# Patient Record
Sex: Male | Born: 1990 | Race: White | Hispanic: No | Marital: Single | Smoking: Never smoker
Health system: Southern US, Community
[De-identification: ages and names within clinical notes are randomized; demographics above are authoritative.]

---

## 2014-09-23 ENCOUNTER — Emergency Department (HOSPITAL_COMMUNITY)
Admission: EM | Admit: 2014-09-23 | Discharge: 2014-09-23 | Disposition: A | Discharge status: Home / Self Care | Payer: Self-pay | Attending: Emergency Medicine | Admitting: Emergency Medicine

## 2014-09-23 ENCOUNTER — Encounter (HOSPITAL_COMMUNITY): Payer: Self-pay | Admitting: Emergency Medicine

## 2014-09-23 DIAGNOSIS — Z207 Contact with and (suspected) exposure to pediculosis, acariasis and other infestations: Secondary | ICD-10-CM

## 2014-09-23 DIAGNOSIS — B86 Scabies: Secondary | ICD-10-CM | POA: Insufficient documentation

## 2014-09-23 DIAGNOSIS — Z2089 Contact with and (suspected) exposure to other communicable diseases: Secondary | ICD-10-CM

## 2014-09-23 MED ORDER — PERMETHRIN 5 % EX CREA: TOPICAL_CREAM | CUTANEOUS | Status: DC

## 2014-09-23 NOTE — ED Provider Notes (Signed)
CSN: [id number]     Arrival date & time 09/23/14  2119 History   First MD Initiated Contact with Patient 09/23/14 2135     Chief Complaint  Patient presents with  . Rash     (Consider location/radiation/quality/duration/timing/severity/associated sxs/prior Treatment) HPI Comments: Chief complaint: Possible exposure to scabies  Patient is a 23 year old male who presents to the emergency department because he has been around someone whose child was diagnosed with scabies. The patient states that a friend of his has been over to his home, and sat on his furniture. The patient states he is also been to the home of the friend and the child. The patient states he has not had any rash, and neither has he had any itching, but he was concerned for possible exposure wanted to be evaluated. The patient denies any medical illnesses. He has not seen any bugs or bites of any kind. He has not had this situation before.  Patient is a 23 y.o. male presenting with rash. The history is provided by the patient.  Rash Associated symptoms: no abdominal pain, no joint pain, no shortness of breath and not wheezing     History reviewed. No pertinent past medical history. History reviewed. No pertinent past surgical history. No family history on file. History  Substance Use Topics  . Smoking status: Never Smoker   . Smokeless tobacco: Not on file  . Alcohol Use: No    Review of Systems  Constitutional: Negative for activity change.       All ROS Neg except as noted in HPI  Eyes: Negative for photophobia and discharge.  Respiratory: Negative for cough, shortness of breath and wheezing.   Cardiovascular: Negative for chest pain and palpitations.  Gastrointestinal: Negative for abdominal pain and blood in stool.  Genitourinary: Negative for dysuria, frequency and hematuria.  Musculoskeletal: Negative for arthralgias, back pain and neck pain.  Skin: Negative.  Negative for rash.  Neurological: Negative for  dizziness, seizures and speech difficulty.  Psychiatric/Behavioral: Negative for hallucinations and confusion.      Allergies  Review of patient's allergies indicates not on file.  Home Medications   Prior to Admission medications   Not on File   BP 135/86  Pulse 71  Temp(Src) 98 F (36.7 C) (Oral)  Resp 24  Ht 5\' 10"  (1.778 m)  Wt 190 lb (86.183 kg)  BMI 27.26 kg/m2  SpO2 100% Physical Exam  Nursing note and vitals reviewed. Constitutional: He is oriented to person, place, and time. He appears well-developed and well-nourished.  Non-toxic appearance.  HENT:  Head: Normocephalic.  Right Ear: Tympanic membrane and external ear normal.  Left Ear: Tympanic membrane and external ear normal.  Eyes: EOM and lids are normal. Pupils are equal, round, and reactive to light.  Neck: Normal range of motion. Neck supple. Carotid bruit is not present.  Cardiovascular: Normal rate, regular rhythm, normal heart sounds, intact distal pulses and normal pulses.   Pulmonary/Chest: Breath sounds normal. No respiratory distress.  Abdominal: Soft. Bowel sounds are normal. There is no tenderness. There is no guarding.  Musculoskeletal: Normal range of motion.  Lymphadenopathy:       Head (right side): No submandibular adenopathy present.       Head (left side): No submandibular adenopathy present.    He has no cervical adenopathy.  Neurological: He is alert and oriented to person, place, and time. He has normal strength. No cranial nerve deficit or sensory deficit.  Skin: Skin is warm and dry.  No rash noted.  Psychiatric: He has a normal mood and affect. His speech is normal.    ED Course  Procedures (including critical care time) Labs Review Labs Reviewed - No data to display  Imaging Review No results found.   EKG Interpretation None      MDM  Vital signs are well within normal limits. Examination does not reveal any rash, crawling bites, or increased areas of redness or  itching. I have informed the patient of his vital signs, as well as the findings on today's examination. Patient will be given a prescription for Elimite in the event that he develops symptoms. Also advised patient to vacuum his furniture and carpet, and to changes linens.    Final diagnoses:  None    **I have reviewed nursing notes, vital signs, and all appropriate lab and imaging results for this patient.Kathie Dike*    Jonuel Butterfield M Veronique Warga, PA-C 09/23/14 2201

## 2014-09-23 NOTE — ED Notes (Signed)
Pt has been around someone with scabies and wants to be checked out. Pt states he is not itching and does not have a rash.

## 2014-09-25 NOTE — ED Provider Notes (Signed)
Medical screening examination/treatment/procedure(s) were performed by non-physician practitioner and as supervising physician I was immediately available for consultation/collaboration.    Vida RollerBrian D Brando Taves, MD 09/25/14 [phone]

## 2015-03-12 ENCOUNTER — Encounter (HOSPITAL_COMMUNITY): Payer: Self-pay | Admitting: *Deleted

## 2015-03-12 ENCOUNTER — Emergency Department (HOSPITAL_COMMUNITY)
Admission: EM | Admit: 2015-03-12 | Discharge: 2015-03-12 | Disposition: A | Discharge status: Home / Self Care | Payer: Self-pay | Attending: Emergency Medicine | Admitting: Emergency Medicine

## 2015-03-12 DIAGNOSIS — R21 Rash and other nonspecific skin eruption: Secondary | ICD-10-CM | POA: Insufficient documentation

## 2015-03-12 MED ORDER — CEFTRIAXONE SODIUM 250 MG IJ SOLR
250.0000 mg | Freq: Once | INTRAMUSCULAR | Status: AC
  Administered 2015-03-12: 250 mg via INTRAMUSCULAR
  Filled 2015-03-12: qty 250

## 2015-03-12 MED ORDER — PREDNISONE 10 MG PO TABS: ORAL_TABLET | ORAL | Status: DC

## 2015-03-12 MED ORDER — LIDOCAINE HCL (PF) 1 % IJ SOLN
INTRAMUSCULAR | Status: AC
  Filled 2015-03-12: qty 5

## 2015-03-12 MED ORDER — DIPHENHYDRAMINE HCL 25 MG PO TABS: 25.0000 mg | ORAL_TABLET | ORAL | Status: DC | PRN

## 2015-03-12 MED ORDER — AZITHROMYCIN 250 MG PO TABS
1000.0000 mg | ORAL_TABLET | Freq: Once | ORAL | Status: AC
  Administered 2015-03-12: 1000 mg via ORAL
  Filled 2015-03-12: qty 4

## 2015-03-12 NOTE — ED Notes (Signed)
Itching , no visible rash, concerned he may have scabies

## 2015-03-12 NOTE — ED Provider Notes (Signed)
CSN: [id number]     Arrival date & time 03/12/15  2209 History   First MD Initiated Contact with Patient 03/12/15 2230     No chief complaint on file.    (Consider location/radiation/quality/duration/timing/severity/associated sxs/prior Treatment) HPI  Mitchell Avery is a 24 y.o. male who presents to the Emergency Department complaining of persistent rash for one week.  He states he noticed a dry, scaly-looking rash to his thighs, groin, waist, and back.  He also complains of constant itching that is worse after bathing and at night.  He has tried over the counter medications without relief.  He states a family member was treated for scabies over one month ago, but he did not have symptoms until recently. He does report unprotected sex with multiple partners and is concerned about a possible STD.  He denies fever, chills, malaise, abd pain, dysuria, penile discharge or testicle pain.     History reviewed. No pertinent past medical history. History reviewed. No pertinent past surgical history. History reviewed. No pertinent family history. History  Substance Use Topics  . Smoking status: Never Smoker   . Smokeless tobacco: Not on file  . Alcohol Use: No    Review of Systems  Constitutional: Negative for fever, chills, activity change and appetite change.  HENT: Negative for facial swelling, sore throat and trouble swallowing.   Respiratory: Negative for chest tightness, shortness of breath and wheezing.   Genitourinary: Positive for genital sores. Negative for dysuria, hematuria, discharge, scrotal swelling, difficulty urinating, penile pain and testicular pain.  Musculoskeletal: Negative for back pain, arthralgias, neck pain and neck stiffness.  Skin: Positive for rash. Negative for wound.  Neurological: Negative for dizziness, weakness, numbness and headaches.  All other systems reviewed and are negative.     Allergies  Review of patient's allergies indicates no known  allergies.  Home Medications   Prior to Admission medications   Medication Sig Start Date End Date Taking? Authorizing Provider  diphenhydrAMINE (BENADRYL) 25 MG tablet Take 1 tablet (25 mg total) by mouth every 4 (four) hours as needed for itching. 03/12/15   Tammi Draylen Lobue, PA-C  permethrin (ELIMITE) 5 % cream Please apply from neck to toes. Leave on for 6 hours and shower off. May repeat in 5 days if needed. 09/23/14   Ivery Quale, PA-C  predniSONE (DELTASONE) 10 MG tablet Take 6 tablets day one, 5 tablets day two, 4 tablets day three, 3 tablets day four, 2 tablets day five, then 1 tablet day six 03/12/15   Tammi Milo Solana, PA-C   BP 129/78 mmHg  Pulse 73  Temp(Src) 98.8 F (37.1 C) (Oral)  Resp 20  Ht  (1.803 m)  Wt 180 lb (81.647 kg)  BMI 25.12 kg/m2  SpO2 98% Physical Exam  Constitutional: He is oriented to person, place, and time. He appears well-developed and well-nourished. No distress.  HENT:  Head: Normocephalic and atraumatic.  Mouth/Throat: Oropharynx is clear and moist. No oral lesions.  Neck: Normal range of motion. Neck supple.  Cardiovascular: Normal rate, regular rhythm, normal heart sounds and intact distal pulses.   No murmur heard. Pulmonary/Chest: Effort normal and breath sounds normal. No respiratory distress.  Genitourinary: Testes normal. Cremasteric reflex is present. Right testis shows no swelling and no tenderness. Left testis shows no swelling and no tenderness. Circumcised. No penile erythema or penile tenderness. No discharge found.  Several small scaly lesions to the shaft of the penis and one to the glans  Musculoskeletal: Normal range of motion. He  exhibits no edema or tenderness.  Lymphadenopathy:    He has no cervical adenopathy.       Right: No inguinal adenopathy present.       Left: No inguinal adenopathy present.  Neurological: He is alert and oriented to person, place, and time. He exhibits normal muscle tone. Coordination normal.  Skin:  Skin is warm. Rash noted. There is erythema.  Few erythematous papules to the abdomen.  Scattered, small macular lesions to the left thigh, bilateral groin, and waist.  Hands and web spaces are spared.    Nursing note and vitals reviewed.   ED Course  Procedures (including critical care time) Labs Review Labs Reviewed  RPR  HIV ANTIBODY (ROUTINE TESTING)  GC/CHLAMYDIA PROBE AMP ()    Imaging Review No results found.   EKG Interpretation None      MDM   Final diagnoses:  Rash and nonspecific skin eruption    Pt is well appearing, non-toxic.  Rash appears c/w non-specific dermatitis, although given patient's history syphilis is also considered.     Cultures pending.  Patient was advised to use safer sex practices.  Treated tonight with IM rocephin and zithromax.  Pt agrees to f/u with health dept.     Severiano Gilbertammi Happy Begeman, PA-C 03/12/15 2326  Lorre NickAnthony Allen, MD 03/13/15 [phone]

## 2015-03-12 NOTE — Discharge Instructions (Signed)
Rash A rash is a change in the color or feel of your skin. There are many different types of rashes. You may have other problems along with your rash. HOME CARE  Avoid the thing that caused your rash.  Do not scratch your rash.  You may take cools baths to help stop itching.  Only take medicines as told by your doctor.  Keep all doctor visits as told. GET HELP RIGHT AWAY IF:   Your pain, puffiness (swelling), or redness gets worse.  You have a fever.  You have new or severe problems.  You have body aches, watery poop (diarrhea), or you throw up (vomit).  Your rash is not better after 3 days. MAKE SURE YOU:   Understand these instructions.  Will watch your condition.  Will get help right away if you are not doing well or get worse. Document Released: 05/08/2008 Document Revised: 02/12/2012 Document Reviewed: 09/04/2011 Exit[employer] Patient Information 2015 [employer]. This information is not intended to replace advice given to you by your health care provider. Make sure you discuss any questions you have with your health care provider.  

## 2015-03-12 NOTE — ED Notes (Signed)
Patient given discharge instruction, verbalized understand. Patient ambulatory out of the department.  

## 2015-03-14 LAB — RPR: RPR: NONREACTIVE

## 2015-03-15 LAB — GC/CHLAMYDIA PROBE AMP (~~LOC~~) NOT AT ARMC
CHLAMYDIA, DNA PROBE: NEGATIVE
NEISSERIA GONORRHEA: NEGATIVE

## 2015-03-16 LAB — HIV ANTIBODY (ROUTINE TESTING W REFLEX): HIV Screen 4th Generation wRfx: NONREACTIVE

## 2015-03-27 ENCOUNTER — Emergency Department (HOSPITAL_COMMUNITY)
Admission: EM | Admit: 2015-03-27 | Discharge: 2015-03-27 | Disposition: A | Discharge status: Home / Self Care | Payer: Self-pay | Attending: Emergency Medicine | Admitting: Emergency Medicine

## 2015-03-27 ENCOUNTER — Encounter (HOSPITAL_COMMUNITY): Payer: Self-pay | Admitting: *Deleted

## 2015-03-27 DIAGNOSIS — R21 Rash and other nonspecific skin eruption: Secondary | ICD-10-CM | POA: Insufficient documentation

## 2015-03-27 DIAGNOSIS — Z7952 Long term (current) use of systemic steroids: Secondary | ICD-10-CM | POA: Insufficient documentation

## 2015-03-27 MED ORDER — PERMETHRIN 5 % EX CREA: TOPICAL_CREAM | CUTANEOUS | Status: DC

## 2015-03-27 MED ORDER — HYDROXYZINE HCL 25 MG PO TABS: 25.0000 mg | ORAL_TABLET | Freq: Four times a day (QID) | ORAL | Status: DC | PRN

## 2015-03-27 NOTE — ED Provider Notes (Signed)
CSN: [id number]     Arrival date & time 03/27/15  2021 History   First MD Initiated Contact with Patient 03/27/15 2047     Chief Complaint  Patient presents with  . Rash     (Consider location/radiation/quality/duration/timing/severity/associated sxs/prior Treatment) HPI   Mitchell Avery is a 24 y.o. male who presents to the Emergency Department complaining of persistent rash.  He reports intermittent, intense itching, and waxing and waning rash to his arms, hands and trunk.  This is a chronic problem and his third visit here for same symptoms.  He states that he made an appt to see a dermatologist, but could not keep the appt because he had to work.  He states that he completed a prednisone course without relief.  He denies swelling, fever, pain, difficulty swallowing or breathing.  He reports previous exposure to scabies and has been prescribed permethrin cream previously.      History reviewed. No pertinent past medical history. History reviewed. No pertinent past surgical history. History reviewed. No pertinent family history. History  Substance Use Topics  . Smoking status: Never Smoker   . Smokeless tobacco: Not on file  . Alcohol Use: No    Review of Systems  Constitutional: Negative for fever, chills, activity change and appetite change.  HENT: Negative for facial swelling, sore throat and trouble swallowing.   Respiratory: Negative for chest tightness, shortness of breath and wheezing.   Musculoskeletal: Negative for neck pain and neck stiffness.  Skin: Positive for rash. Negative for wound.  Neurological: Negative for dizziness, weakness, numbness and headaches.  All other systems reviewed and are negative.     Allergies  Review of patient's allergies indicates no known allergies.  Home Medications   Prior to Admission medications   Medication Sig Start Date End Date Taking? Authorizing Provider  diphenhydrAMINE (BENADRYL) 25 MG tablet Take 1 tablet (25 mg  total) by mouth every 4 (four) hours as needed for itching. 03/12/15   Kerryn Tennant, PA-C  hydrOXYzine (ATARAX/VISTARIL) 25 MG tablet Take 1 tablet (25 mg total) by mouth every 6 (six) hours as needed for itching. 03/27/15   Tou Hayner, PA-C  permethrin (ELIMITE) 5 % cream Apply from the neck down, leave on for 10 hrs. Then wash off.  May re-apply in one week. 03/27/15   Ansh Fauble, PA-C  predniSONE (DELTASONE) 10 MG tablet Take 6 tablets day one, 5 tablets day two, 4 tablets day three, 3 tablets day four, 2 tablets day five, then 1 tablet day six 03/12/15   Shuree Brossart, PA-C   BP 139/90 mmHg  Pulse 75  Temp(Src) 98.6 F (37 C) (Oral)  Resp 18  Ht 5\' 11"  (1.803 m)  Wt 174 lb (78.926 kg)  BMI 24.28 kg/m2  SpO2 100% Physical Exam  Constitutional: He is oriented to person, place, and time. He appears well-developed and well-nourished. No distress.  HENT:  Head: Normocephalic and atraumatic.  Mouth/Throat: Oropharynx is clear and moist.  Eyes: No scleral icterus.  Neck: Normal range of motion. Neck supple.  Cardiovascular: Normal rate, regular rhythm, normal heart sounds and intact distal pulses.   No murmur heard. Pulmonary/Chest: Effort normal and breath sounds normal. No respiratory distress. He has no wheezes.  Musculoskeletal: He exhibits no edema or tenderness.  Lymphadenopathy:    He has no cervical adenopathy.  Neurological: He is alert and oriented to person, place, and time. He exhibits normal muscle tone. Coordination normal.  Skin: Skin is warm. Rash noted. There is erythema.  No obvious rash seen on exam, mild excoriations to the bilateral forearms.  No edema or petechiae.    Nursing note and vitals reviewed.   ED Course  Procedures (including critical care time) Labs Review Labs Reviewed - No data to display  Imaging Review No results found.   EKG Interpretation None      MDM   Final diagnoses:  Rash and nonspecific skin eruption    Pt is well  appearing.  Third ED visit for same.  When asked if he has made an appt with dermatology, he states that he missed his appt due to work and going out of town next week, so will not be able to make another one.  No obvious rash noted on exam.  Pt is well appearing, vitals stable will repeat Permethrin cream and stressed importance of dermatology f/u    Pauline Aus, PA-C 03/29/15 1453  Rolland Porter, MD 04/06/15 [phone]

## 2015-03-27 NOTE — ED Notes (Signed)
Pt states his cousin has had scabies. Pt states he has been here before because he was itching all over. Pt states he still itches.

## 2015-03-27 NOTE — Discharge Instructions (Signed)
Rash A rash is a change in the color or feel of your skin. There are many different types of rashes. You may have other problems along with your rash. HOME CARE  Avoid the thing that caused your rash.  Do not scratch your rash.  You may take cools baths to help stop itching.  Only take medicines as told by your doctor.  Keep all doctor visits as told. GET HELP RIGHT AWAY IF:   Your pain, puffiness (swelling), or redness gets worse.  You have a fever.  You have new or severe problems.  You have body aches, watery poop (diarrhea), or you throw up (vomit).  Your rash is not better after 3 days. MAKE SURE YOU:   Understand these instructions.  Will watch your condition.  Will get help right away if you are not doing well or get worse. Document Released: 05/08/2008 Document Revised: 02/12/2012 Document Reviewed: 09/04/2011 Exit[employer] Patient Information 2015 [employer]. This information is not intended to replace advice given to you by your health care provider. Make sure you discuss any questions you have with your health care provider.  

## 2015-03-30 ENCOUNTER — Emergency Department (HOSPITAL_COMMUNITY): Payer: No Typology Code available for payment source

## 2015-03-30 ENCOUNTER — Encounter (HOSPITAL_COMMUNITY): Payer: Self-pay

## 2015-03-30 ENCOUNTER — Emergency Department (HOSPITAL_COMMUNITY)
Admission: EM | Admit: 2015-03-30 | Discharge: 2015-03-31 | Disposition: A | Discharge status: Home / Self Care | Payer: No Typology Code available for payment source | Attending: Emergency Medicine | Admitting: Emergency Medicine

## 2015-03-30 DIAGNOSIS — Z23 Encounter for immunization: Secondary | ICD-10-CM | POA: Diagnosis not present

## 2015-03-30 DIAGNOSIS — Z79899 Other long term (current) drug therapy: Secondary | ICD-10-CM | POA: Insufficient documentation

## 2015-03-30 DIAGNOSIS — Y92410 Unspecified street and highway as the place of occurrence of the external cause: Secondary | ICD-10-CM | POA: Diagnosis not present

## 2015-03-30 DIAGNOSIS — S41112A Laceration without foreign body of left upper arm, initial encounter: Secondary | ICD-10-CM | POA: Insufficient documentation

## 2015-03-30 DIAGNOSIS — Y9389 Activity, other specified: Secondary | ICD-10-CM | POA: Diagnosis not present

## 2015-03-30 DIAGNOSIS — Y998 Other external cause status: Secondary | ICD-10-CM | POA: Diagnosis not present

## 2015-03-30 DIAGNOSIS — S8991XA Unspecified injury of right lower leg, initial encounter: Secondary | ICD-10-CM | POA: Diagnosis not present

## 2015-03-30 DIAGNOSIS — S59902A Unspecified injury of left elbow, initial encounter: Secondary | ICD-10-CM | POA: Insufficient documentation

## 2015-03-30 LAB — URINALYSIS, ROUTINE W REFLEX MICROSCOPIC
Bilirubin Urine: NEGATIVE
GLUCOSE, UA: NEGATIVE mg/dL
KETONES UR: NEGATIVE mg/dL
Leukocytes, UA: NEGATIVE
Nitrite: NEGATIVE
Protein, ur: NEGATIVE mg/dL
Specific Gravity, Urine: 1.02 (ref 1.005–1.030)
UROBILINOGEN UA: 0.2 mg/dL (ref 0.0–1.0)
pH: 6 (ref 5.0–8.0)

## 2015-03-30 LAB — CBC WITH DIFFERENTIAL/PLATELET
Basophils Absolute: 0 10*3/uL (ref 0.0–0.1)
Basophils Relative: 0 % (ref 0–1)
Eosinophils Absolute: 0.2 10*3/uL (ref 0.0–0.7)
Eosinophils Relative: 3 % (ref 0–5)
HCT: 45.9 % (ref 39.0–52.0)
HEMOGLOBIN: 15.2 g/dL (ref 13.0–17.0)
Lymphocytes Relative: 15 % (ref 12–46)
Lymphs Abs: 1.3 10*3/uL (ref 0.7–4.0)
MCH: 27.1 pg (ref 26.0–34.0)
MCHC: 33.1 g/dL (ref 30.0–36.0)
MCV: 82 fL (ref 78.0–100.0)
MONO ABS: 0.6 10*3/uL (ref 0.1–1.0)
Monocytes Relative: 6 % (ref 3–12)
Neutro Abs: 6.6 10*3/uL (ref 1.7–7.7)
Neutrophils Relative %: 76 % (ref 43–77)
Platelets: 285 10*3/uL (ref 150–400)
RBC: 5.6 MIL/uL (ref 4.22–5.81)
RDW: 13.8 % (ref 11.5–15.5)
WBC: 8.7 10*3/uL (ref 4.0–10.5)

## 2015-03-30 LAB — URINE MICROSCOPIC-ADD ON

## 2015-03-30 LAB — I-STAT CHEM 8, ED
BUN: 16 mg/dL (ref 6–23)
CALCIUM ION: 1.18 mmol/L (ref 1.12–1.23)
Chloride: 105 mmol/L (ref 96–112)
Creatinine, Ser: 1.1 mg/dL (ref 0.50–1.35)
Glucose, Bld: 108 mg/dL — ABNORMAL HIGH (ref 70–99)
HCT: 49 % (ref 39.0–52.0)
HEMOGLOBIN: 16.7 g/dL (ref 13.0–17.0)
POTASSIUM: 4 mmol/L (ref 3.5–5.1)
Sodium: 142 mmol/L (ref 135–145)
TCO2: 23 mmol/L (ref 0–100)

## 2015-03-30 LAB — ETHANOL: Alcohol, Ethyl (B): <5 mg/dL (ref 0–9)

## 2015-03-30 MED ORDER — SODIUM CHLORIDE 0.9 % IV BOLUS (SEPSIS)
1000.0000 mL | Freq: Once | INTRAVENOUS | Status: AC
  Administered 2015-03-30: 1000 mL via INTRAVENOUS

## 2015-03-30 MED ORDER — HYDROMORPHONE HCL 1 MG/ML IJ SOLN
1.0000 mg | Freq: Once | INTRAMUSCULAR | Status: AC
  Administered 2015-03-30: 1 mg via INTRAVENOUS
  Filled 2015-03-30: qty 1

## 2015-03-30 MED ORDER — ONDANSETRON HCL 4 MG/2ML IJ SOLN
4.0000 mg | Freq: Once | INTRAMUSCULAR | Status: AC
  Administered 2015-03-30: 4 mg via INTRAVENOUS
  Filled 2015-03-30: qty 2

## 2015-03-30 MED ORDER — CEPHALEXIN 500 MG PO CAPS: 500.0000 mg | ORAL_CAPSULE | Freq: Three times a day (TID) | ORAL | Status: DC

## 2015-03-30 MED ORDER — TETANUS-DIPHTH-ACELL PERTUSSIS 5-2.5-18.5 LF-MCG/0.5 IM SUSP
0.5000 mL | Freq: Once | INTRAMUSCULAR | Status: AC
  Administered 2015-03-30: 0.5 mL via INTRAMUSCULAR
  Filled 2015-03-30: qty 0.5

## 2015-03-30 MED ORDER — LIDOCAINE-EPINEPHRINE (PF) 1 %-1:200000 IJ SOLN
20.0000 mL | Freq: Once | INTRAMUSCULAR | Status: AC
  Administered 2015-03-31: 20 mL via INTRADERMAL
  Filled 2015-03-30: qty 10

## 2015-03-30 MED ORDER — IOHEXOL 300 MG/ML  SOLN
100.0000 mL | Freq: Once | INTRAMUSCULAR | Status: AC | PRN
  Administered 2015-03-30: 100 mL via INTRAVENOUS

## 2015-03-30 MED ORDER — HYDROCODONE-ACETAMINOPHEN 5-325 MG PO TABS: 1.0000 | ORAL_TABLET | Freq: Four times a day (QID) | ORAL | Status: DC | PRN

## 2015-03-30 NOTE — Discharge Instructions (Signed)
Follow up with Dr. Conchita ParisNundkumar in 4 weeks for your neck.  Follow up with Dr. Romeo AppleHarrison next week for recheck of your leg and arm.  Suture out in 10 days.  Clean cuts twice a day with soap and water
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
## 2015-03-30 NOTE — ED Notes (Signed)
Pt's sister out to say that pt is in pain.
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
## 2015-03-31 MED ORDER — LIDOCAINE HCL 2 % EX GEL
1.0000 "application " | Freq: Once | CUTANEOUS | Status: AC
  Administered 2015-03-31: 1 via TOPICAL
  Filled 2015-03-31: qty 20

## 2015-03-31 MED ORDER — OXYCODONE-ACETAMINOPHEN 5-325 MG PO TABS: 1.0000 | ORAL_TABLET | Freq: Four times a day (QID) | ORAL | Status: DC | PRN
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
## 2015-04-13 MED FILL — Oxycodone w/ Acetaminophen Tab 5-325 MG: ORAL | Qty: 6 | Status: AC
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
## 2018-07-12 ENCOUNTER — Emergency Department (HOSPITAL_BASED_OUTPATIENT_CLINIC_OR_DEPARTMENT_OTHER)
Admission: EM | Admit: 2018-07-12 | Discharge: 2018-07-13 | Disposition: A | Discharge status: Home / Self Care | Payer: Self-pay | Attending: Emergency Medicine | Admitting: Emergency Medicine

## 2018-07-12 ENCOUNTER — Other Ambulatory Visit: Payer: Self-pay

## 2018-07-12 ENCOUNTER — Encounter (HOSPITAL_BASED_OUTPATIENT_CLINIC_OR_DEPARTMENT_OTHER): Payer: Self-pay | Admitting: *Deleted

## 2018-07-12 DIAGNOSIS — J029 Acute pharyngitis, unspecified: Secondary | ICD-10-CM | POA: Insufficient documentation

## 2018-07-12 DIAGNOSIS — Z79899 Other long term (current) drug therapy: Secondary | ICD-10-CM | POA: Insufficient documentation

## 2018-07-12 LAB — GROUP A STREP BY PCR: Group A Strep by PCR: NOT DETECTED

## 2018-07-12 MED ORDER — ACETAMINOPHEN 325 MG PO TABS
650.0000 mg | ORAL_TABLET | Freq: Once | ORAL | Status: AC | PRN
  Administered 2018-07-12: 650 mg via ORAL
  Filled 2018-07-12: qty 2

## 2018-07-12 MED ORDER — SODIUM CHLORIDE 0.9 % IV BOLUS
1000.0000 mL | Freq: Once | INTRAVENOUS | Status: AC
  Administered 2018-07-12: 1000 mL via INTRAVENOUS

## 2018-07-12 MED ORDER — ACETAMINOPHEN 325 MG PO TABS: 650.0000 mg | ORAL_TABLET | Freq: Once | ORAL | Status: DC

## 2018-07-12 NOTE — ED Provider Notes (Signed)
[facility] EMERGENCY DEPARTMENT Provider Note  CSN: [id number] Arrival date & time: 07/12/18  2122  History   Chief Complaint Chief Complaint  Patient presents with  . Headache  . Sore Throat    HPI Mitchell Avery is a 27 y.o. male with no significant medical history who presented to the ED for sore throat x3 days. Associated symptoms: fever, fatigue and headache. Denies known sick contacts, but states he is frequently around people for his job. Denies cough, congestion, rhinorrhea and postnasal drip. Denies arthralgias, rashes, chest pain, palpitations or urinary complaints. Patient has tried nothing prior to coming to the ED.  History reviewed. No pertinent past medical history.  There are no active problems to display for this patient.   History reviewed. No pertinent surgical history.      Home Medications    Prior to Admission medications   Medication Sig Start Date End Date Taking? Authorizing Provider  cephALEXin (KEFLEX) 500 MG capsule Take 1 capsule (500 mg total) by mouth 3 (three) times daily. 03/30/15   Bethann BerkshireZammit, Joseph, MD  diphenhydrAMINE (BENADRYL) 25 MG tablet Take 1 tablet (25 mg total) by mouth every 4 (four) hours as needed for itching. Patient not taking: Reported on 03/30/2015 03/12/15   Pauline Ausriplett, Tammy, PA-C  HYDROcodone-acetaminophen (NORCO/VICODIN) 5-325 MG per tablet Take 1 tablet by mouth every 6 (six) hours as needed. 03/30/15   Bethann BerkshireZammit, Joseph, MD  hydrOXYzine (ATARAX/VISTARIL) 25 MG tablet Take 1 tablet (25 mg total) by mouth every 6 (six) hours as needed for itching. 03/27/15   Triplett, Tammy, PA-C  oxyCODONE-acetaminophen (PERCOCET/ROXICET) 5-325 MG per tablet Take 1 tablet by mouth every 6 (six) hours as needed. 03/31/15   Bethann BerkshireZammit, Joseph, MD  permethrin (ELIMITE) 5 % cream Apply from the neck down, leave on for 10 hrs. Then wash off.  May re-apply in one week. 03/27/15   Triplett, Tammy, PA-C  predniSONE (DELTASONE) 10 MG tablet Take 6 tablets day  one, 5 tablets day two, 4 tablets day three, 3 tablets day four, 2 tablets day five, then 1 tablet day six Patient not taking: Reported on 03/30/2015 03/12/15   Pauline Ausriplett, Tammy, PA-C    Family History No family history on file.  Social History Social History   Tobacco Use  . Smoking status: Never Smoker  . Smokeless tobacco: Never Used  Substance Use Topics  . Alcohol use: Yes    Comment: occ  . Drug use: No     Allergies   Patient has no known allergies.   Review of Systems Review of Systems  Constitutional: Positive for fatigue and fever. Negative for activity change and appetite change.  HENT: Positive for sore throat. Negative for congestion, drooling, ear pain, postnasal drip, rhinorrhea, sneezing and voice change.   Eyes: Negative.   Respiratory: Negative.   Cardiovascular: Negative.   Gastrointestinal: Negative.   Genitourinary: Negative.   Musculoskeletal: Negative.   Skin: Negative.   Neurological: Positive for headaches. Negative for dizziness, weakness and light-headedness.     Physical Exam Updated Vital Signs BP 113/71   Pulse 95   Temp 98.5 F (36.9 C) (Oral)   Resp 18   Ht 5\' 10"  (1.778 m)   Wt 90.7 kg   SpO2 97%   BMI 28.70 kg/m   Physical Exam  Constitutional: He appears well-developed and well-nourished.  HENT:  Head: Normocephalic and atraumatic.  Right Ear: Tympanic membrane, external ear and ear canal normal.  Left Ear: Tympanic membrane, external ear and ear canal  normal.  Mouth/Throat: Uvula is midline, oropharynx is clear and moist and mucous membranes are normal. No oral lesions. No trismus in the jaw. Tonsils are 1+ on the right. Tonsillar exudate.  Eyes: Pupils are equal, round, and reactive to light. Conjunctivae, EOM and lids are normal.  Neck: Normal range of motion. Neck supple.  Cardiovascular: Normal rate, regular rhythm and normal heart sounds.  Pulmonary/Chest: Effort normal and breath sounds normal. No respiratory  distress.  Musculoskeletal: Normal range of motion. He exhibits no tenderness.  Skin: Skin is warm and intact. Capillary refill takes less than 2 seconds. No rash noted.  Nursing note and vitals reviewed.    ED Treatments / Results  Labs (all labs ordered are listed, but only abnormal results are displayed) Labs Reviewed  GROUP A STREP BY PCR    EKG None  Radiology No results found.  Procedures Procedures (including critical care time)  Medications Ordered in ED Medications  acetaminophen (TYLENOL) tablet 650 mg (650 mg Oral Given 07/12/18 2136)  sodium chloride 0.9 % bolus 1,000 mL (1,000 mLs Intravenous New Bag/Given 07/12/18 2230)     Initial Impression / Assessment and Plan / ED Course  Triage vital signs and the nursing notes have been reviewed.  Pertinent labs & imaging results that were available during care of the patient were reviewed and considered in medical decision making (see chart for details).  Patient presents febrile and tachycardic with complaints of sore throat. Physical exam significant for right tonsillar exudate and swelling. Concern for strep.There is no peritonsillar abscess visualized. He does not have trismus, voice changes, respiratory complaints and is not in respiratory distress that raises concern for retropharyngeal process.  Clinical Course as of Jul 12 2337  Fri Jul 12, 2018  2138 Tylenol given in triage for fever. Tachycardic at 146 upon arrival. Will order 1L NS bolus.   [GM]  2205 Strep negative   [GM]  2307 HR re-evaluated after NS bolus initiated. HR is decreasing appropriately with fluids.   [GM]  2337 Patient afebrile with normal HR prior to discharge.   [GM]    Clinical Course User Index [GM] Mortis, Sharyon Medicus, PA-C   Patient does not have any other respiratory symptoms such as cough, congestion or postnasal drip. He also does not have any arthralgias, skin rashes or GU complaints that would suggest that the pharyngitis is  related to a STD infection. Since strep test is negative, there is no indication for antibiotics today. Likely viral etiology.  Final Clinical Impressions(s) / ED Diagnoses  1. Viral Pharyngitis. Education provided on OTC and supportive treatment for symptom relief. Advised to follow-up with medical provider if sore throat continues or worsens over the next 3-4 days.  Dispo: Home. After thorough clinical evaluation, this patient is determined to be medically stable and can be safely discharged with the previously mentioned treatment and/or outpatient follow-up/referral(s). At this time, there are no other apparent medical conditions that require further screening, evaluation or treatment.   Final diagnoses:  Viral pharyngitis    ED Discharge Orders    None        Reva Bores 07/12/18 2338    Gwyneth Sprout, MD 07/12/18 2351

## 2018-07-12 NOTE — Discharge Instructions (Addendum)
Your strep test was negative which means your sore throat is likely coming from a viral source. You may use over the counter cold and sore throat products for symptom relief. Tylenol can be continued for fever.  If you continue to have sore throat and fever or if it worsens by Monday 07/15/18, please follow-up with a medical provider.  Get well soon. Enjoy your weekend.

## 2018-07-12 NOTE — ED Triage Notes (Signed)
Body aches x 3 days. Sore throat and chills.

## 2018-07-15 ENCOUNTER — Emergency Department (HOSPITAL_COMMUNITY): Payer: Self-pay

## 2018-07-15 ENCOUNTER — Emergency Department (HOSPITAL_COMMUNITY)
Admission: EM | Admit: 2018-07-15 | Discharge: 2018-07-15 | Disposition: A | Discharge status: Home / Self Care | Payer: Self-pay | Attending: Emergency Medicine | Admitting: Emergency Medicine

## 2018-07-15 ENCOUNTER — Other Ambulatory Visit: Payer: Self-pay

## 2018-07-15 ENCOUNTER — Encounter (HOSPITAL_COMMUNITY): Payer: Self-pay | Admitting: Emergency Medicine

## 2018-07-15 DIAGNOSIS — J039 Acute tonsillitis, unspecified: Secondary | ICD-10-CM | POA: Insufficient documentation

## 2018-07-15 DIAGNOSIS — J029 Acute pharyngitis, unspecified: Secondary | ICD-10-CM | POA: Insufficient documentation

## 2018-07-15 LAB — BASIC METABOLIC PANEL
Anion gap: 10 (ref 5–15)
BUN: 13 mg/dL (ref 6–20)
CALCIUM: 9.1 mg/dL (ref 8.9–10.3)
CO2: 25 mmol/L (ref 22–32)
Chloride: 99 mmol/L (ref 98–111)
Creatinine, Ser: 1.1 mg/dL (ref 0.61–1.24)
GFR calc Af Amer: >60 mL/min (ref >60)
GFR calc non Af Amer: >60 mL/min (ref >60)
GLUCOSE: 97 mg/dL (ref 70–99)
Potassium: 3.5 mmol/L (ref 3.5–5.1)
Sodium: 134 mmol/L — ABNORMAL LOW (ref 135–145)

## 2018-07-15 LAB — CBC WITH DIFFERENTIAL/PLATELET
Abs Immature Granulocytes: 0.1 10*3/uL (ref 0.0–0.1)
BASOS PCT: 1 %
Basophils Absolute: 0.1 10*3/uL (ref 0.0–0.1)
Eosinophils Absolute: 0 10*3/uL (ref 0.0–0.7)
Eosinophils Relative: 0 %
HEMATOCRIT: 42.5 % (ref 39.0–52.0)
Hemoglobin: 13.7 g/dL (ref 13.0–17.0)
Immature Granulocytes: 1 %
LYMPHS ABS: 1.3 10*3/uL (ref 0.7–4.0)
Lymphocytes Relative: 14 %
MCH: 25.9 pg — AB (ref 26.0–34.0)
MCHC: 32.2 g/dL (ref 30.0–36.0)
MCV: 80.3 fL (ref 78.0–100.0)
MONO ABS: 1.2 10*3/uL — AB (ref 0.1–1.0)
Monocytes Relative: 13 %
Neutro Abs: 6.6 10*3/uL (ref 1.7–7.7)
Neutrophils Relative %: 71 %
PLATELETS: 289 10*3/uL (ref 150–400)
RBC: 5.29 MIL/uL (ref 4.22–5.81)
RDW: 12.8 % (ref 11.5–15.5)
WBC: 9.2 10*3/uL (ref 4.0–10.5)

## 2018-07-15 LAB — GROUP A STREP BY PCR: Group A Strep by PCR: NOT DETECTED

## 2018-07-15 LAB — I-STAT CG4 LACTIC ACID, ED: Lactic Acid, Venous: 0.81 mmol/L (ref 0.5–1.9)

## 2018-07-15 MED ORDER — SODIUM CHLORIDE 0.9 % IV BOLUS
1000.0000 mL | Freq: Once | INTRAVENOUS | Status: AC
  Administered 2018-07-15: 1000 mL via INTRAVENOUS

## 2018-07-15 MED ORDER — IOHEXOL 300 MG/ML  SOLN
75.0000 mL | Freq: Once | INTRAMUSCULAR | Status: AC | PRN
  Administered 2018-07-15: 75 mL via INTRAVENOUS

## 2018-07-15 MED ORDER — ACETAMINOPHEN 325 MG PO TABS
650.0000 mg | ORAL_TABLET | Freq: Once | ORAL | Status: AC | PRN
  Administered 2018-07-15: 650 mg via ORAL
  Filled 2018-07-15: qty 2

## 2018-07-15 MED ORDER — KETOROLAC TROMETHAMINE 30 MG/ML IJ SOLN
15.0000 mg | Freq: Once | INTRAMUSCULAR | Status: AC
  Administered 2018-07-15: 15 mg via INTRAVENOUS
  Filled 2018-07-15: qty 1

## 2018-07-15 MED ORDER — DEXAMETHASONE SODIUM PHOSPHATE 10 MG/ML IJ SOLN
10.0000 mg | Freq: Once | INTRAMUSCULAR | Status: AC
  Administered 2018-07-15: 10 mg via INTRAVENOUS
  Filled 2018-07-15: qty 1

## 2018-07-15 MED ORDER — AMOXICILLIN 500 MG PO CAPS: 500.0000 mg | ORAL_CAPSULE | Freq: Two times a day (BID) | ORAL | 0 refills | Status: AC

## 2018-07-15 NOTE — ED Notes (Signed)
Pt informed of delay called CT, 3 patients in front of patient

## 2018-07-15 NOTE — ED Provider Notes (Signed)
[facility] EMERGENCY DEPARTMENT Provider Note   CSN: [id number] Arrival date & time: 07/15/18  1231     History   Chief Complaint Chief Complaint  Patient presents with  . Sore Throat  . Fatigue  . Generalized Body Aches    HPI Mitchell Avery is a 27 y.o. male.  HPI   Mitchell Avery is a 27 y.o. male, patient with no pertinent past medical history, presenting to the ED with sore throat increasing over the last 6 days.  Pain is largely on the right side, sharp, severe, radiating superiorly.  States he has been taking Tylenol.  Endorses poor oral intake.  States he now has a headache and nausea.  Was seen in the ED on August 9 and diagnosed with viral pharyngitis.  Denies drooling, shortness of breath, cough, change in voice, difficulty swallowing, neck stiffness, rash, or any other complaints.    History reviewed. No pertinent past medical history.  There are no active problems to display for this patient.   History reviewed. No pertinent surgical history.      Home Medications    Prior to Admission medications   Medication Sig Start Date End Date Taking? Authorizing Provider  cephALEXin (KEFLEX) 500 MG capsule Take 1 capsule (500 mg total) by mouth 3 (three) times daily. 03/30/15   Bethann Berkshire, MD  diphenhydrAMINE (BENADRYL) 25 MG tablet Take 1 tablet (25 mg total) by mouth every 4 (four) hours as needed for itching. Patient not taking: Reported on 03/30/2015 03/12/15   Pauline Aus, PA-C  HYDROcodone-acetaminophen (NORCO/VICODIN) 5-325 MG per tablet Take 1 tablet by mouth every 6 (six) hours as needed. 03/30/15   Bethann Berkshire, MD  hydrOXYzine (ATARAX/VISTARIL) 25 MG tablet Take 1 tablet (25 mg total) by mouth every 6 (six) hours as needed for itching. 03/27/15   Triplett, Tammy, PA-C  oxyCODONE-acetaminophen (PERCOCET/ROXICET) 5-325 MG per tablet Take 1 tablet by mouth every 6 (six) hours as needed. 03/31/15   Bethann Berkshire, MD  permethrin  (ELIMITE) 5 % cream Apply from the neck down, leave on for 10 hrs. Then wash off.  May re-apply in one week. 03/27/15   Triplett, Tammy, PA-C  predniSONE (DELTASONE) 10 MG tablet Take 6 tablets day one, 5 tablets day two, 4 tablets day three, 3 tablets day four, 2 tablets day five, then 1 tablet day six Patient not taking: Reported on 03/30/2015 03/12/15   Pauline Aus, PA-C    Family History No family history on file.  Social History Social History   Tobacco Use  . Smoking status: Never Smoker  . Smokeless tobacco: Never Used  Substance Use Topics  . Alcohol use: Yes    Comment: occ  . Drug use: No     Allergies   Patient has no known allergies.   Review of Systems Review of Systems  Constitutional: Positive for fatigue and fever.  HENT: Positive for sore throat. Negative for drooling, facial swelling, trouble swallowing and voice change.   Respiratory: Negative for cough and shortness of breath.   Cardiovascular: Negative for chest pain.  Gastrointestinal: Positive for nausea. Negative for abdominal pain, diarrhea and vomiting.  Musculoskeletal: Negative for neck pain and neck stiffness.  Neurological: Positive for headaches.  All other systems reviewed and are negative.    Physical Exam Updated Vital Signs BP (!) 134/96 (BP Location: Right Arm)   Pulse (!) 121   Temp (!) 102.1 F (38.9 C) (Oral)   Resp 16   Ht 5\' 10"  (  1.778 m)   Wt 90.7 kg   SpO2 93%   BMI 28.70 kg/m   Physical Exam  Constitutional: He appears well-developed and well-nourished. No distress.  HENT:  Head: Normocephalic and atraumatic.  Mouth/Throat: Oropharyngeal exudate present.  Patient's tonsillar exudate appears to be bilateral, however, patient has no tenderness on the left side of the neck.  Uvula appears to be slightly deviated towards the left.  No stridor.  Eyes: Conjunctivae are normal.  Neck: Normal range of motion. Neck supple. No neck rigidity.    Tender area of swelling  about the size of a ping-pong ball to the patient's right neck.  Dentition appears to be intact and stable.  No noted area of swelling or fluctuance.  No trismus.  Mouth opening to at least 3 finger widths.  Handles oral secretions without difficulty.  No noted facial swelling.   Cardiovascular: Regular rhythm, normal heart sounds and intact distal pulses. Tachycardia present.  Pulmonary/Chest: Effort normal and breath sounds normal. No respiratory distress.  Abdominal: Soft. There is no tenderness. There is no guarding.  Musculoskeletal: He exhibits no edema.  Lymphadenopathy:    He has cervical adenopathy.  Neurological: He is alert.  Skin: Skin is warm and dry. He is not diaphoretic.  Psychiatric: He has a normal mood and affect. His behavior is normal.  Nursing note and vitals reviewed.    ED Treatments / Results  Labs (all labs ordered are listed, but only abnormal results are displayed) Labs Reviewed  BASIC METABOLIC PANEL - Abnormal; Notable for the following components:      Result Value   Sodium 134 (*)    All other components within normal limits  CBC WITH DIFFERENTIAL/PLATELET - Abnormal; Notable for the following components:   MCH 25.9 (*)    Monocytes Absolute 1.2 (*)    All other components within normal limits  GROUP A STREP BY PCR  I-STAT CG4 LACTIC ACID, ED    EKG None  Radiology Dg Chest 2 View  Result Date: 07/15/2018 CLINICAL DATA:  Fever and cough since Friday EXAM: CHEST - 2 VIEW COMPARISON:  March 30, 2015 FINDINGS: The heart size and mediastinal contours are within normal limits. Minimal atelectasis of both lung bases are noted. There is no focal infiltrate, pulmonary edema, or pleural effusion. The visualized skeletal structures are unremarkable. IMPRESSION: Minimal atelectasis of lung bases are noted. No focal consolidation is noted. Electronically Signed   By: Sherian ReinWei-Chen  Lin M.D.   On: 07/15/2018 13:54    Procedures Procedures (including critical  care time)  Medications Ordered in ED Medications  acetaminophen (TYLENOL) tablet 650 mg (650 mg Oral Given 07/15/18 1325)  sodium chloride 0.9 % bolus 1,000 mL (1,000 mLs Intravenous New Bag/Given 07/15/18 1429)  ketorolac (TORADOL) 30 MG/ML injection 15 mg (15 mg Intravenous Given 07/15/18 1445)  dexamethasone (DECADRON) injection 10 mg (10 mg Intravenous Given 07/15/18 1445)     Initial Impression / Assessment and Plan / ED Course  I have reviewed the triage vital signs and the nursing notes.  Pertinent labs & imaging results that were available during my care of the patient were reviewed by me and considered in my medical decision making (see chart for details).     Patient presents with sore throat and fever.  Pain is largely right-sided and accompanied by palpable swelling in the region.  Area of swelling could simply be large lymphadenopathy, however, due to the patient's presentation and overall exam findings, rule out abscess is warranted.  End of shift patient care handoff report given to Harlene SaltsBrandon Morelli, PA-C. Plan: Reviewed CT results.  If abscess noted, administer clindamycin, contact ENT, likely office follow-up.     Final Clinical Impressions(s) / ED Diagnoses   Final diagnoses:  None    ED Discharge Orders    None       Concepcion LivingJoy, Trinidad Ingle C, PA-C 07/15/18 1720    Gwyneth SproutPlunkett, Whitney, MD 07/23/18 2027

## 2018-07-15 NOTE — ED Notes (Signed)
ED Provider at bedside. 

## 2018-07-15 NOTE — Discharge Instructions (Signed)
Please take your antibiotic, amoxicillin, as prescribed. Please follow-up with your primary care provider regarding your visit today.  I have attached resources below to establish a primary care provider if you do not already have one. You may also call the number to the ENT office to schedule a follow-up appointment. Please return to the emergency department for any new or worsening symptoms or if your symptoms do not improve. You may also use Tylenol as directed on the packaging for pain and fever.  Contact a health care provider if: Large, tender lumps develop in your neck. A rash develops. A green, yellow-brown, or bloody substance is coughed up. You are unable to swallow liquids or food for 24 hours. You notice that only one of the tonsils is swollen. Get help right away if: You develop any new symptoms such as vomiting, severe headache, stiff neck, chest pain, or trouble breathing or swallowing. You have severe throat pain along with drooling or voice changes. You have severe pain, unrelieved with recommended medications. You are unable to fully open the mouth. You develop redness, swelling, or severe pain anywhere in the neck. You have a fever.  RESOURCE GUIDE  Chronic Pain Problems: Contact [facility]  [phone] Patients need to be referred by their primary care doctor.  Insufficient Money for Medicine: Contact United Way:  call "211" or [facility] [phone].  No Primary Care Doctor: Call Health Connect  [phone] - can help you locate a primary care doctor that  accepts your insurance, provides certain services, etc. Physician Referral Service- [phone]  Agencies that provide inexpensive medical care: [facility]  [phone] [facility]  [phone] [facility]  [phone] [facility]  [phone] Planned Parenthood  [phone] [facility]  [phone]  Medicaid-accepting [facility]  [place] Providers: [facility]- [address], [address]  [phone], Mon-Fri 9am-7pm, Sat 9am-1pm [facility]- [address], [address] [place]  [phone] [facility]- [address], [address] [place]  [phone] [facility]- [address] [place] [address]  [phone] Renaye RakersVeita Bland- [address], [address], [phone]  Only accepts [place] patients after they have their name  applied to their card  Self Pay (no insurance) in [facility]: Sickle Cell Patients: Dr Willey BladeEric Dean, [facility]  [address], [phone] [facility]- [address]  [phone]       Redge Gainer-     [facility]- [address], [address]       -     [facility]- see information above (Speak to CitigroupPam H if you do not have insurance)       -  [facility]- [address], [phone]       -  [facility] [place]- [address],  [phone]       -  [facility]- [address] [place] [address], [phone]       -  Dr Julio Sickssei-Bonsu-  [address], [address], [place], [phone]       -  [facility]- [address], [phone]       -  [facility] - [address] [place] [address], [phone], also [address], [phone]       -    [facility]- [address], [phone],  1st & 3rd Saturday   every month, 10am-1pm  1) Find a Doctor and Pay Out of Pocket Although you won't have to find out who is covered by your insurance plan, it is a good idea to ask around and get recommendations. You will then need to call the office and see if the doctor you have chosen will accept you as a new patient and what types of options they offer for patients who are self-pay. Some doctors offer discounts or will set up payment plans for their patients who do not have insurance, but you will need to ask so you aren't surprised when you get to your appointment.  2)  Contact Your Local Health Department Not all health departments have doctors that can see patients for sick visits, but many do, so it is worth a call to see if yours does. If you don't know where your local health department is, you can check in your phone book. The CDC also has a tool to help you locate your state's health department, and many state websites also have listings of all of their local health departments.  3) Find a Walk-in Clinic If your illness is not likely to be very severe or complicated, you may want to try a walk in clinic. These are popping up all over the country in pharmacies, drugstores, and shopping centers. They're usually staffed by nurse practitioners or physician assistants that have been trained to treat common illnesses and complaints. They're usually fairly quick and inexpensive. However, if you have serious medical issues or chronic medical problems, these are probably not your best option  STD Testing [facility] Department of [facility] [place], STD Clinic, [address], [place], phone [phone] or [phone].  Monday - Friday, call for an appointment. [facility] Department of [facility] [place], STD Clinic, [place], [place], phone [phone] or [phone].  Monday - Friday, call for an appointment.  Abuse/Neglect: [facility] Child Abuse Hotline [phone]) [phone] [facility] Child Abuse Hotline 913-151-5763220-847-2703 (After Hours)  Emergency Shelter:  [facility] [phone]  Maternity Homes: [facility] [phone](336) [phone] [facility]Fl[facility] [phone]  MRSA Hotline #:   620-861-1104770-494-3256  [facility] Resources  [facility] [place]  United Way [facility]. [address]                 [address]         [address] [place] [address]  [place]                                               [place]                               [place] Phone:  [phone]                                  Phone:  [phone]                   Phone:  [phone]  [facility], [phone] [facility] - [facility] [phone]- [phone]       -  [facility] in [place], [address],                                  [phone], [facility]  [place] Child Abuse Hotline 906-424-9688(336) 9478413551 or 901-877-5403(336) 3347070090 (After Hours)   Behavioral Health Services  Substance Abuse Resources: [facility] Drug [facility]  [phone] [facility] [phone] [facility] [phone] Floydene FlockDaymark [phone] Residential & Outpatient Substance Abuse Program  [phone]  Psychological Services: [facility]  [phone] [facility]  [phone] [facility], [phone] [place], [place], ACCESS LINE: [phone] or [phone], [email or web address]  Dental Assistance  If unable to pay or uninsured, contact:  [facility] Serve or [facility]. to become qualified for the adult dental clinic.  Patients with Medicaid: [facility] [phone], [phone] [address], [phone]  If unable to pay, or uninsured, contact [facility] [phone]) or [facility] [phone] in [place], [phone] in [facility]) to become qualified for the adult dental clinic   Other Low-Cost Community Dental Services: [facility]- [address], [place], [place], [phone], [phone], Ext. 123, 2nd and 4th Thursday of the month at 6:30am.  10 clients each day by appointment, can sometimes see walk-in patients if someone does not show for an appointment. [facility]- [address], [place], [place], [phone], [phone] [facility]- [address], [place], [place], [id number], [phone] [facility]  Department- [phone] [facility]- [phone] [facility] [phone]- [phone]

## 2018-07-15 NOTE — ED Provider Notes (Signed)
Patient handoff taken from [place]awn Joy, [place] Jersey[place]-C. Patient presented for sore throat, CT performed for concern of abscess.  Upon my evaluation patient states that he is feeling much improved, states that his pain is now 1/10 in severity. Physical Exam  BP 123/66   Pulse 82   Temp 98.8 F (37.1 C) (Oral)   Resp 18   Ht 5\' 10"  (1.778 m)   Wt 90.7 kg   SpO2 96%   BMI 28.70 kg/m   Physical Exam  Constitutional: He is oriented to person, place, and time. He appears well-developed and well-nourished. No distress.  HENT:  Head: Normocephalic and atraumatic.  Right Ear: Hearing and external ear normal.  Left Ear: Hearing and external ear normal.  Nose: Nose normal.  Mouth/Throat: Uvula is midline and mucous membranes are normal. No uvula swelling. Oropharyngeal exudate present.  Eyes: Pupils are equal, round, and reactive to light. EOM are normal.  Neck: Trachea normal and normal range of motion. Neck supple. No spinous process tenderness present. No tracheal deviation, no edema, no erythema and normal range of motion present.    Pulmonary/Chest: Effort normal. No stridor. No respiratory distress.  Abdominal: Soft. There is no tenderness. There is no rebound and no guarding.  Musculoskeletal: Normal range of motion.  Neurological: He is alert and oriented to person, place, and time.  Skin: Skin is warm and dry.  Psychiatric: He has a normal mood and affect. His behavior is normal.    ED Course/Procedures   Clinical Course as of Jul 15 2057  Mon Jul 15, 2018  2057 IMPRESSION: 1. Acute tonsillopharyngitis of the adenoid and palatine tonsils without peritonsillar or retropharyngeal abscess. 2. Reactive bilateral level 2A lymphadenopathy.   Electronically Signed By: Deatra RobinsonKevin Herman M.D. On: 07/15/2018 18:49   [BM]    Clinical Course User Index [BM] Bill SalinasMorelli, Shenelle Klas A, PA-C    Procedures  MDM  Patient presenting with Sore Throat and body aches for the past 6 days.  Patient  febrile tachycardic upon arrival, patient treated with Decadron, Toradol, Tylenol here department with great improvement per patient.  Upon my evaluation patient is denying pain, states that he is feeling well and is ready to be discharged.  CT of the head and neck shows acute tonsillopharyngitis with the adenoid and palatine tonsils without peritonsillar or retropharyngeal abscess, it also shows reactive bilateral to a lymphadenopathy.   No sign of abscess.  I have prescribed the patient amoxicillin 500 twice daily for the next 10 days.  I have also encouraged the patient to use OTC Tylenol as directed on packaging.  I have given the patient a referral to ENT follow-up.  I have also given patient resources to establish a primary care provider.  I have spoken with the patient at length about return precautions, he states understanding return precautions.  Upon my evaluation patient is drinking liquids without difficulty, states that he feels that he is able to swallow pills.  Normal phonation, no drooling, airway intact.  Strep negative, heart rate normal, patient is afebrile at discharge, lactic acid within normal limits, CBC is nonacute, BMP is nonacute.  Patient appears safe for discharge at this time.  At this time there does not appear to be any evidence of an acute emergency medical condition and the patient appears stable for discharge with appropriate outpatient follow up. Diagnosis was discussed with patient who verbalizes understanding of care plan and is agreeable to discharge. I have discussed return precautions with patient who verbalizes understanding of  return precautions. Patient strongly encouraged to follow-up with their PCP. All questions answered.   Note: Portions of this report may have been transcribed using voice recognition software. Every effort was made to ensure accuracy; however, inadvertent computerized transcription errors may still be present.         Elizabeth PalauMorelli, Roselia Snipe  A, PA-C 07/15/18 2104    Gwyneth SproutPlunkett, Whitney, MD 07/15/18 2308

## 2018-07-15 NOTE — ED Notes (Addendum)
Patient transported to x-ray. ?

## 2018-07-15 NOTE — ED Triage Notes (Signed)
Pt returns with fevers as high has 103 at home. He reports body aches, sore throat, and headaches for 6 days. [facility] instructed take [facility] meds. Pt is a/o x4 NAD.

## 2018-07-15 NOTE — ED Notes (Signed)
Patient transported to CT 

## 2019-06-17 ENCOUNTER — Encounter (HOSPITAL_BASED_OUTPATIENT_CLINIC_OR_DEPARTMENT_OTHER): Payer: Self-pay | Admitting: *Deleted

## 2019-06-17 ENCOUNTER — Emergency Department (HOSPITAL_BASED_OUTPATIENT_CLINIC_OR_DEPARTMENT_OTHER)
Admission: EM | Admit: 2019-06-17 | Discharge: 2019-06-17 | Disposition: A | Discharge status: Home / Self Care | Payer: Self-pay | Attending: Emergency Medicine | Admitting: Emergency Medicine

## 2019-06-17 ENCOUNTER — Emergency Department (HOSPITAL_BASED_OUTPATIENT_CLINIC_OR_DEPARTMENT_OTHER): Payer: Self-pay

## 2019-06-17 ENCOUNTER — Other Ambulatory Visit: Payer: Self-pay

## 2019-06-17 DIAGNOSIS — S0232XA Fracture of orbital floor, left side, initial encounter for closed fracture: Secondary | ICD-10-CM | POA: Insufficient documentation

## 2019-06-17 DIAGNOSIS — Y9289 Other specified places as the place of occurrence of the external cause: Secondary | ICD-10-CM | POA: Insufficient documentation

## 2019-06-17 DIAGNOSIS — Y9319 Activity, other involving water and watercraft: Secondary | ICD-10-CM | POA: Insufficient documentation

## 2019-06-17 DIAGNOSIS — Y999 Unspecified external cause status: Secondary | ICD-10-CM | POA: Insufficient documentation

## 2019-06-17 MED ORDER — OXYCODONE-ACETAMINOPHEN 5-325 MG PO TABS: 2.0000 | ORAL_TABLET | ORAL | 0 refills | Status: AC | PRN

## 2019-06-17 MED ORDER — TETRACAINE HCL 0.5 % OP SOLN
2.0000 [drp] | Freq: Once | OPHTHALMIC | Status: AC
  Administered 2019-06-17: 2 [drp] via OPHTHALMIC
  Filled 2019-06-17: qty 4

## 2019-06-17 MED ORDER — FLUORESCEIN SODIUM 1 MG OP STRP
1.0000 | ORAL_STRIP | Freq: Once | OPHTHALMIC | Status: AC
  Administered 2019-06-17: 1 via OPHTHALMIC
  Filled 2019-06-17: qty 1

## 2019-06-17 NOTE — ED Notes (Signed)
ED Provider at bedside. 
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
## 2019-06-17 NOTE — ED Notes (Signed)
Pt to CT
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
